# Patient Record
Sex: Female | Born: 1965 | Race: White | Hispanic: No | Marital: Married | Smoking: Never smoker
Health system: Southern US, Community
[De-identification: ages and names within clinical notes are randomized; demographics above are authoritative.]

## PROBLEM LIST (undated history)

## (undated) DIAGNOSIS — G35 Multiple sclerosis: Secondary | ICD-10-CM

## (undated) DIAGNOSIS — I10 Essential (primary) hypertension: Secondary | ICD-10-CM

## (undated) HISTORY — DX: Multiple sclerosis: G35

## (undated) HISTORY — DX: Essential (primary) hypertension: I10

---

## 2007-03-08 HISTORY — PX: DILATION AND CURETTAGE OF UTERUS: SHX78

## 2013-11-19 LAB — BASIC METABOLIC PANEL
Creatinine: 0.8 mg/dL (ref 0.5–1.1)
POTASSIUM: 4.8 mmol/L (ref 3.4–5.3)
SODIUM: 136 mmol/L — AB (ref 137–147)

## 2013-11-19 LAB — LIPID PANEL
Cholesterol: 240 mg/dL — AB (ref 0–200)
HDL: 59 mg/dL (ref 35–70)
LDL CALC: 156 mg/dL
TRIGLYCERIDES: 126 mg/dL (ref 40–160)

## 2013-11-19 LAB — TSH: TSH: 2.39 u[IU]/mL (ref 0.41–5.90)

## 2013-11-19 LAB — HEPATIC FUNCTION PANEL
ALT: 27 U/L (ref 7–35)
AST: 27 U/L (ref 13–35)

## 2014-11-27 ENCOUNTER — Ambulatory Visit (INDEPENDENT_AMBULATORY_CARE_PROVIDER_SITE_OTHER): Payer: BLUE CROSS/BLUE SHIELD | Admitting: Osteopathic Medicine

## 2014-11-27 ENCOUNTER — Encounter: Payer: Self-pay | Admitting: Osteopathic Medicine

## 2014-11-27 VITALS — BP 148/88 | HR 73 | Ht 70.0 in | Wt 266.0 lb

## 2014-11-27 DIAGNOSIS — H6190 Disorder of external ear, unspecified, unspecified ear: Secondary | ICD-10-CM

## 2014-11-27 DIAGNOSIS — G35 Multiple sclerosis: Secondary | ICD-10-CM

## 2014-11-27 DIAGNOSIS — H61899 Other specified disorders of external ear, unspecified ear: Secondary | ICD-10-CM

## 2014-11-27 DIAGNOSIS — H60391 Other infective otitis externa, right ear: Secondary | ICD-10-CM | POA: Diagnosis not present

## 2014-11-27 DIAGNOSIS — H7291 Unspecified perforation of tympanic membrane, right ear: Secondary | ICD-10-CM | POA: Diagnosis not present

## 2014-11-27 DIAGNOSIS — H60399 Other infective otitis externa, unspecified ear: Secondary | ICD-10-CM | POA: Insufficient documentation

## 2014-11-27 DIAGNOSIS — H6193 Disorder of external ear, unspecified, bilateral: Secondary | ICD-10-CM

## 2014-11-27 HISTORY — DX: Multiple sclerosis: G35

## 2014-11-27 NOTE — Progress Notes (Signed)
HPI: Kayla Walter is a 49 y.o. female who presents to [facility]  today for chief complaint of:  Chief Complaint  Patient presents with  . Establish Care    MS since 2004, R ear pain   R ear pain . Location: R ear . Quality: pressure/pain . Severity: moderate . Duration: few weeks . Context: over past few years has had major ear infections, seen in [place]. Reports hearing loss and pressure which improved with [facility] flushing at [facility] . Modifying factors: never had abx otic drops filled . Assoc signs/symptoms: no cough, (+) sinus pressure/congestion   Past medical, social and family history reviewed: No past medical history on file. No past surgical history on file. Social History  Substance Use Topics  . Smoking status: Not on file  . Smokeless tobacco: Not on file  . Alcohol Use: Not on file   No family history on file.  No current outpatient prescriptions on file.   No current facility-administered medications for this visit.   Allergies not on file    Review of Systems: CONSTITUTIONAL: Neg fever/chills, no unintentional weight changes, (+) weakness generalized/fatigue HEAD/EYES/EARS/NOSE/THROAT: No headache/vision change, no sore throat (+)hearing concerns in R ear (+) pain /pressure R ear CARDIAC: No chest pain/pressure/palpitations, no orthopnea RESPIRATORY: No cough/shortness of breath/wheeze GASTROINTESTINAL: No nausea/vomiting/abdominal pain/blood in stool/diarrhea/constipation MUSCULOSKELETAL: (+) myalgia/arthralgia GENITOURINARY: (+) incontinence, No abnormal genital bleeding/discharge SKIN: No rash/wounds/concerning lesions HEM/ONC: (+) easy bruising/bleeding, no abnormal lymph node ENDOCRINE: No polyuria/polydipsia/polyphagia, no heat/cold intolerance  NEUROLOGIC: No dizzines/slurred speech. (+)MS. (+)concern for h/a, memory problems PSYCHIATRIC: No concerns with depression/anxiety or sleep problems    Exam:  There were no  vitals taken for this visit. Constitutional: VSS, see above. General Appearance: alert, well-developed, well-nourished, NAD Eyes: Normal lids and conjunctive, non-icteric sclera, PERRLA Ears, Nose, Mouth, Throat: Normal external inspection nares/mouth/lips/gums, Normal external canal and TM on L, MMM, posterior pharynx without erythema/exudate. R ear (+) debris obsciring TM, see below for procedure note. Debris appears to be wax/skin sloughed off canal. Once debris removed, skin reveled to be erythematous,  Possible purulence vs debris. Pt unable to tolerate further exam Respiratory: Normal respiratory effort. no wheeze/rhonchi/rales Cardiovascular: S1/S2 normal, no murmur/rub/gallop auscultated. RRR.   No results found for this or any previous visit (from the past 72 hour(s)).  Ceruminosis/debris is noted.  Wax/debris is removed by syringing and manual debridement. ENT referral pending.   ASSESSMENT/PLAN:  Otitis, externa, infective, right - Recently seen at [facility] given Ofloxacin 10/29/2014 - Plan: Ambulatory referral to ENT, fill ofloxacin and further recs per ENT - cannot determine if TM ruptured, unable to visualize due to debris and patient unable to tolerate further cleaning.   Ruptured tympanic membrane, right - Plan: Ambulatory referral to ENT  Multiple sclerosis - Diagnosed 2004    Separate visit to address incontinence, fatigue, heme concerns as per ROS

## 2014-11-28 ENCOUNTER — Telehealth: Payer: Self-pay | Admitting: Osteopathic Medicine

## 2014-11-28 ENCOUNTER — Telehealth: Payer: Self-pay | Admitting: *Deleted

## 2014-11-28 NOTE — Telephone Encounter (Signed)
Patient called adv that she has rescheduled appt for ent until 12/09/14 but request to have the specimen that was found from ear so she can take with her to her ent appt. Pt has a f/up appt in our office next Thurs 12/04/14. Thanks

## 2014-11-28 NOTE — Telephone Encounter (Signed)
Patient had called in with questions concerning specimen from ear. Wanting to know if the specimen was sent to pathology. Dr Lyn Hollingshead said she could come pick up specimen & take to ENT appointment. Patient was originally scheduled for appointment today @ [facility] @ 2:00. Well patient rescheduled appointment for 10/3 due to childcare issue. Has a f/u appointment on 10/29 & would Dr Lyn Hollingshead to hold specimen until then.

## 2014-11-28 NOTE — Telephone Encounter (Signed)
Ok

## 2014-12-04 ENCOUNTER — Ambulatory Visit: Payer: BLUE CROSS/BLUE SHIELD | Admitting: Osteopathic Medicine

## 2014-12-04 ENCOUNTER — Ambulatory Visit (INDEPENDENT_AMBULATORY_CARE_PROVIDER_SITE_OTHER): Payer: BLUE CROSS/BLUE SHIELD | Admitting: Osteopathic Medicine

## 2014-12-04 ENCOUNTER — Encounter: Payer: Self-pay | Admitting: Osteopathic Medicine

## 2014-12-04 VITALS — BP 150/90 | HR 77 | Ht 70.0 in

## 2014-12-04 DIAGNOSIS — IMO0001 Reserved for inherently not codable concepts without codable children: Secondary | ICD-10-CM

## 2014-12-04 DIAGNOSIS — H60391 Other infective otitis externa, right ear: Secondary | ICD-10-CM | POA: Diagnosis not present

## 2014-12-04 DIAGNOSIS — H6190 Disorder of external ear, unspecified, unspecified ear: Secondary | ICD-10-CM

## 2014-12-04 DIAGNOSIS — H6193 Disorder of external ear, unspecified, bilateral: Secondary | ICD-10-CM | POA: Diagnosis not present

## 2014-12-04 DIAGNOSIS — B369 Superficial mycosis, unspecified: Secondary | ICD-10-CM

## 2014-12-04 DIAGNOSIS — B368 Other specified superficial mycoses: Secondary | ICD-10-CM | POA: Diagnosis not present

## 2014-12-04 DIAGNOSIS — E669 Obesity, unspecified: Secondary | ICD-10-CM

## 2014-12-04 DIAGNOSIS — Z13220 Encounter for screening for lipoid disorders: Secondary | ICD-10-CM | POA: Diagnosis not present

## 2014-12-04 DIAGNOSIS — H61899 Other specified disorders of external ear, unspecified ear: Secondary | ICD-10-CM

## 2014-12-04 DIAGNOSIS — R030 Elevated blood-pressure reading, without diagnosis of hypertension: Secondary | ICD-10-CM

## 2014-12-04 DIAGNOSIS — H6240 Otitis externa in other diseases classified elsewhere, unspecified ear: Principal | ICD-10-CM

## 2014-12-04 MED ORDER — CLOTRIMAZOLE 1 % EX SOLN: 1.0000 "application " | Freq: Two times a day (BID) | CUTANEOUS | Status: DC

## 2014-12-04 NOTE — Progress Notes (Signed)
HPI: Kayla Walter is a 49 y.o. female who presents to [facility]  today for chief complaint of:  No chief complaint on file.  R ear pain . Location: R ear . Quality: pressure/pain, itching . Severity: moderate . Duration: 3 weeks . Context: over past few years has had major ear infections, seen in [place]. Reports hearing loss and pressure which improved with ear flushing at [facility] and was feeling better after our visit last week when debris was removed. Last visit started on topical abx but hasn't been taking these regularly. Hasn't seen ENT, an urgent next-day referral was placed however patient had to reschedule due to childcare problems.    Past medical, social and family history reviewed: Past Medical History  Diagnosis Date  . Multiple sclerosis 11/27/2014    Diagnosed 2004   Past Surgical History  Procedure Laterality Date  . Dilation and curettage of uterus  2009    Miscarriage   Social History  Substance Use Topics  . Smoking status: Never Smoker   . Smokeless tobacco: Not on file  . Alcohol Use: No   Family History  Problem Relation Age of Onset  . Alcohol abuse Mother   . Lung cancer Mother   . Depression Mother   . Hyperlipidemia Mother   . Hypertension Mother   . Alcohol abuse Father   . Lung cancer Father   . Alcohol abuse Brother   . HIV Brother   . Diabetes Maternal Grandfather     Current Outpatient Prescriptions  Medication Sig Dispense Refill  . ofloxacin (FLOXIN) 0.3 % otic solution Place 10 drops into the right ear daily.    . clotrimazole (LOTRIMIN) 1 % external solution Apply 1 application topically 2 (two) times daily. 3-4 drops in the ear twice daily for 10-14 days 30 mL 0   No current facility-administered medications for this visit.   No Known Allergies    Review of Systems: CONSTITUTIONAL: Neg fever/chills, HEAD/EYES/EARS/NOSE/THROAT: No headache/vision change, no sore throat (-)hearing concerns in R  ear (+) pain/itching R ear CARDIAC: No chest pain/pressure/palpitations, no orthopnea RESPIRATORY: No cough/shortness of breath/wheeze   Exam:  BP 176/75 mmHg  Pulse 77  Ht 5\' 10"  (1.778 m) BP improved on recheck Constitutional: VSS, see above. General Appearance: alert, well-developed, well-nourished, NAD Eyes: Normal lids and conjunctive, non-icteric sclera, PERRLA Ears, Nose, Mouth, Throat: Normal external inspection nares/mouth/lips/gums, Normal external canal and TM on L, MMM, posterior pharynx without erythema/exudate. R ear (+) whitish coating ear canal, no purulence, mild erythema, still unablet o determine if TM present, likely not... Pt unable to tolerate complete cleaning  Respiratory: Normal respiratory effort. no wheeze/rhonchi/rales Cardiovascular: S1/S2 normal, no murmur/rub/gallop auscultated. RRR.   No results found for this or any previous visit (from the past 72 hour(s)).  Debris/yeast is noted in R ear canal.  Wax/debris is removed by manual debridement. ENT referral pending.   ASSESSMENT/PLAN:  Otomycosis - Plan: clotrimazole (LOTRIMIN) 1 % external solution, stop ofloxacin, stop ofloxacin since she wasn't compliant with this at any rate. Advised MUST see ENT, she states she will keep her appt 4 days from now. Advised needs to go to ENT sooner if possible, needs to go to ER if hearing loss or vision changes. Culture collected but treat empirically for yeast, wet mount sent which will hopefully confirm candida if this is present though culture will likely not be benficial (per UpToDate, reserve fungal culture for severe cases/immunocompromise), will defer further management to ENT  Otitis, externa, infective, right  Debris in ear canal  Lipid screening - Plan: Lipid panel  Obesity - Plan: TSH  Elevated blood pressure - Plan: COMPLETE METABOLIC PANEL WITH GFR, CBC with Differential/Platelet - pt states is always high in doctors offices...  Separate visit to address  annual physical, BP, incontinence, fatigue, other concerns as per ROS

## 2014-12-04 NOTE — Patient Instructions (Signed)
YOU NEED TO USE EAR DROPS AS DIRECTED. YOU NEED TO SEE THE EAR/NOSE/THROAT SPECIALIST ASAP.

## 2014-12-04 NOTE — Addendum Note (Signed)
Addended by: Juel Burrow on: 12/04/2014 02:47 PM   Modules accepted: Orders

## 2014-12-06 LAB — EAR CULTURE

## 2015-01-16 ENCOUNTER — Ambulatory Visit (INDEPENDENT_AMBULATORY_CARE_PROVIDER_SITE_OTHER): Payer: BLUE CROSS/BLUE SHIELD | Admitting: Family Medicine

## 2015-01-16 ENCOUNTER — Encounter: Payer: Self-pay | Admitting: Family Medicine

## 2015-01-16 VITALS — BP 158/90 | HR 76 | Temp 99.1°F | Resp 18

## 2015-01-16 DIAGNOSIS — IMO0001 Reserved for inherently not codable concepts without codable children: Secondary | ICD-10-CM

## 2015-01-16 DIAGNOSIS — J0100 Acute maxillary sinusitis, unspecified: Secondary | ICD-10-CM | POA: Diagnosis not present

## 2015-01-16 DIAGNOSIS — R030 Elevated blood-pressure reading, without diagnosis of hypertension: Secondary | ICD-10-CM | POA: Diagnosis not present

## 2015-01-16 MED ORDER — AZITHROMYCIN 250 MG PO TABS: ORAL_TABLET | ORAL | Status: AC

## 2015-01-16 MED ORDER — FLUTICASONE PROPIONATE 50 MCG/ACT NA SUSP: 1.0000 | Freq: Every day | NASAL | Status: DC

## 2015-01-16 NOTE — Progress Notes (Signed)
   Subjective:    Patient ID: Kayla Walter, female    DOB: 1965-10-10, 49 y.o.   MRN: [id number]  HPI Has ST that started yesterday. Started feeling bad last night.  + low grade temp. She has severe congestion.  No cough. + exposure to ST. one of her children and she had strep throat last week and her other child had a severe cold over the weekend. She says she's had colds before but this is very different. She said it hit very hard. She has a sore throat mostly on the right. And says that her teeth and facial cheek hurting the worse on the right.   Review of Systems     Objective:   Physical Exam  Constitutional: She is oriented to person, place, and time. She appears well-developed and well-nourished.  HENT:  Head: Normocephalic and atraumatic.  Right Ear: External ear normal.  Left Ear: External ear normal.  Nose: Nose normal.  Mouth/Throat: Oropharynx is clear and moist.  TMs and canals are clear.   Eyes: Conjunctivae and EOM are normal. Pupils are equal, round, and reactive to light.  Neck: Neck supple. No thyromegaly present.  Cardiovascular: Normal rate, regular rhythm and normal heart sounds.   Pulmonary/Chest: Effort normal and breath sounds normal. She has no wheezes.  Lymphadenopathy:    She has no cervical adenopathy.  Neurological: She is alert and oriented to person, place, and time.  Skin: Skin is warm and dry.  Psychiatric: She has a normal mood and affect.          Assessment & Plan:  Acute sinusitis with pharyngitis-will test for strep throat since she did have a positive family member at home with it last week. Certainly possible she could have strep throat along with an upper respiratory infection. Also consider this could be viral. They with the severity of her symptoms and the abruptness of onset we'll consider treatment azithromycin. We'll give her prescription to fill if she is feeling worse over the weekend. We'll also add fluticasone to help open up the  nasal passages. Okay to use Mucinex if she would like. I would consider flu but she has not cough.    Elevated blood pressure-most likely cause of her headaches though he is acute illness can cause this as well. We discussed getting a home blood pressure cuff and try taking at home and then coming in a few weeks when she's feeling better and bringing her home cuff with her to compare to ours. If it's accurate then she may just have whitecoat hypertension. But it looks like her pressures of an elevated last couple time she's been here. I think we just need to confirm whether not she actually has a diagnosis of high blood pressure or not.

## 2015-01-16 NOTE — Patient Instructions (Signed)

## 2015-02-11 ENCOUNTER — Encounter: Payer: Self-pay | Admitting: Osteopathic Medicine

## 2015-07-13 ENCOUNTER — Ambulatory Visit: Payer: BLUE CROSS/BLUE SHIELD | Admitting: Advanced Practice Midwife

## 2015-07-22 ENCOUNTER — Telehealth: Payer: Self-pay | Admitting: Osteopathic Medicine

## 2015-07-22 ENCOUNTER — Encounter: Payer: Self-pay | Admitting: Obstetrics & Gynecology

## 2015-07-22 ENCOUNTER — Ambulatory Visit (INDEPENDENT_AMBULATORY_CARE_PROVIDER_SITE_OTHER): Payer: BLUE CROSS/BLUE SHIELD | Admitting: Obstetrics & Gynecology

## 2015-07-22 VITALS — BP 152/98 | HR 80 | Ht 70.0 in | Wt 275.0 lb

## 2015-07-22 DIAGNOSIS — Z1151 Encounter for screening for human papillomavirus (HPV): Secondary | ICD-10-CM | POA: Diagnosis not present

## 2015-07-22 DIAGNOSIS — Z124 Encounter for screening for malignant neoplasm of cervix: Secondary | ICD-10-CM | POA: Diagnosis not present

## 2015-07-22 DIAGNOSIS — IMO0001 Reserved for inherently not codable concepts without codable children: Secondary | ICD-10-CM

## 2015-07-22 DIAGNOSIS — Z01419 Encounter for gynecological examination (general) (routine) without abnormal findings: Secondary | ICD-10-CM

## 2015-07-22 DIAGNOSIS — R300 Dysuria: Secondary | ICD-10-CM

## 2015-07-22 DIAGNOSIS — E669 Obesity, unspecified: Secondary | ICD-10-CM

## 2015-07-22 DIAGNOSIS — R030 Elevated blood-pressure reading, without diagnosis of hypertension: Principal | ICD-10-CM

## 2015-07-22 NOTE — Progress Notes (Signed)
Subjective:    Kayla Walter is a 50 y.o. MW P0 female who presents for an annual exam. The patient has no complaints today. She is having some vulvovaginal itching for several weeks. She has not used any OTC meds.  The patient is sexually active. GYN screening history: last pap: was abnormal: with cotesting. The patient wears seatbelts: yes. The patient participates in regular exercise: no. Has the patient ever been transfused or tattooed?: yes. The patient reports that there is not domestic violence in her life.   Menstrual History: OB History    No data available      Menarche age: 3  Patient's last menstrual period was 07/11/2015.    The following portions of the patient's history were reviewed and updated as appropriate: allergies, current medications, past family history, past medical history, past social history, past surgical history and problem list.  Review of Systems Pertinent items noted in HPI and remainder of comprehensive ROS otherwise negative.  Married for 8 years, denies dyspareunia. Homemaker, in the process of adopting 3 sons (sibs- 15,12, and 33 yo). Moved here from [facility] about a 18 months ago. Mammogram due. Periods are like clockwork, monthly.    Objective:    BP 152/98 mmHg  Pulse 80  Ht 5\' 10"  (1.778 m)  Wt 275 lb (124.739 kg)  BMI 39.46 kg/m2  LMP 07/11/2015  General Appearance:    Alert, cooperative, no distress, appears stated age  Head:    Normocephalic, without obvious abnormality, atraumatic  Eyes:    PERRL, conjunctiva/corneas clear, EOM's intact, fundi    benign, both eyes  Ears:    Normal TM's and external ear canals, both ears  Nose:   Nares normal, septum midline, mucosa normal, no drainage    or sinus tenderness  Throat:   Lips, mucosa, and tongue normal; teeth and gums normal  Neck:   Supple, symmetrical, trachea midline, no adenopathy;    thyroid:  no enlargement/tenderness/nodules; no carotid   bruit or JVD  Back:     Symmetric, no curvature,  ROM normal, no CVA tenderness  Lungs:     Clear to auscultation bilaterally, respirations unlabored  Chest Wall:    No tenderness or deformity   Heart:    Regular rate and rhythm, S1 and S2 normal, no murmur, rub   or gallop  Breast Exam:    No tenderness, masses, or nipple abnormality  Abdomen:     Soft, non-tender, bowel sounds active all four quadrants,    no masses, no organomegaly  Genitalia:    Normal female without lesion, discharge or tenderness, NSSA, NT, no palpable adnexal masses     Extremities:   Extremities normal, atraumatic, no cyanosis or edema  Pulses:   2+ and symmetric all extremities  Skin:   Skin color, texture, turgor normal, no rashes or lesions  Lymph nodes:   Cervical, supraclavicular, and axillary nodes normal  Neurologic:   CNII-XII intact, normal strength, sensation and reflexes    throughout  .    Assessment:    Healthy female exam.    Plan:     Mammogram. Thin prep Pap smear.   Rec colon cancer screening (discussed colonoscopy and cologuard)

## 2015-07-22 NOTE — Telephone Encounter (Signed)
Patient needs to have labs done/she never had done in Sept. 2016.  Can you please reorder the labs so she can go tomorrow morning to have them done?  thanks

## 2015-07-22 NOTE — Telephone Encounter (Signed)
LABS HAVE BEEN ORDERED. Tanice Petre,CMA

## 2015-07-22 NOTE — Addendum Note (Signed)
Addended by: Anell Barr on: 07/22/2015 10:22 AM   Modules accepted: Orders

## 2015-07-23 LAB — CYTOLOGY - PAP

## 2015-07-29 ENCOUNTER — Ambulatory Visit: Payer: BLUE CROSS/BLUE SHIELD

## 2015-08-05 ENCOUNTER — Ambulatory Visit (INDEPENDENT_AMBULATORY_CARE_PROVIDER_SITE_OTHER): Payer: BLUE CROSS/BLUE SHIELD

## 2015-08-05 DIAGNOSIS — Z01419 Encounter for gynecological examination (general) (routine) without abnormal findings: Secondary | ICD-10-CM

## 2015-08-05 DIAGNOSIS — R928 Other abnormal and inconclusive findings on diagnostic imaging of breast: Secondary | ICD-10-CM

## 2015-08-05 NOTE — Addendum Note (Signed)
Addended by: Granville Lewis on: 08/05/2015 09:08 AM   Modules accepted: Orders

## 2015-08-07 LAB — CULTURE, URINE COMPREHENSIVE
COLONY COUNT: NO GROWTH
ORGANISM ID, BACTERIA: NO GROWTH

## 2015-08-11 ENCOUNTER — Encounter: Payer: Self-pay | Admitting: Obstetrics & Gynecology

## 2015-09-03 ENCOUNTER — Other Ambulatory Visit (HOSPITAL_COMMUNITY): Payer: Self-pay | Admitting: Obstetrics & Gynecology

## 2015-09-03 ENCOUNTER — Telehealth: Payer: Self-pay

## 2015-09-03 DIAGNOSIS — R928 Other abnormal and inconclusive findings on diagnostic imaging of breast: Secondary | ICD-10-CM

## 2015-09-03 DIAGNOSIS — Z0000 Encounter for general adult medical examination without abnormal findings: Secondary | ICD-10-CM

## 2015-09-03 NOTE — Telephone Encounter (Signed)
Patient called stated that she is getting labs done on tomorrow and she request order for labs to get her Liver function tested. Please advise. Rhonda Cunningham,CMA

## 2015-09-04 LAB — CBC WITH DIFFERENTIAL/PLATELET
BASOS ABS: 89 {cells}/uL (ref 0–200)
Basophils Relative: 1 %
EOS PCT: 5 %
Eosinophils Absolute: 445 cells/uL (ref 15–500)
HCT: 40.7 % (ref 35.0–45.0)
Hemoglobin: 13.6 g/dL (ref 11.7–15.5)
LYMPHS PCT: 26 %
Lymphs Abs: 2314 cells/uL (ref 850–3900)
MCH: 29.3 pg (ref 27.0–33.0)
MCHC: 33.4 g/dL (ref 32.0–36.0)
MCV: 87.7 fL (ref 80.0–100.0)
MONOS PCT: 11 %
MPV: 9.8 fL (ref 7.5–12.5)
Monocytes Absolute: 979 cells/uL — ABNORMAL HIGH (ref 200–950)
NEUTROS ABS: 5073 {cells}/uL (ref 1500–7800)
Neutrophils Relative %: 57 %
PLATELETS: 330 10*3/uL (ref 140–400)
RBC: 4.64 MIL/uL (ref 3.80–5.10)
RDW: 14.1 % (ref 11.0–15.0)
WBC: 8.9 10*3/uL (ref 3.8–10.8)

## 2015-09-04 NOTE — Telephone Encounter (Signed)
Orders in - patient still due for annual physical exam / wellness visit, please ask her to make an appointment.

## 2015-09-04 NOTE — Telephone Encounter (Signed)
Left message on patient vm advising patient to schedule appointment. Rhonda Cunningham,CMA

## 2015-09-05 LAB — COMPLETE METABOLIC PANEL WITH GFR
ALT: 17 U/L (ref 6–29)
AST: 17 U/L (ref 10–35)
Albumin: 4.2 g/dL (ref 3.6–5.1)
Alkaline Phosphatase: 63 U/L (ref 33–130)
BILIRUBIN TOTAL: 0.5 mg/dL (ref 0.2–1.2)
BUN: 13 mg/dL (ref 7–25)
CHLORIDE: 104 mmol/L (ref 98–110)
CO2: 24 mmol/L (ref 20–31)
Calcium: 9.2 mg/dL (ref 8.6–10.4)
Creat: 0.76 mg/dL (ref 0.50–1.05)
GFR, Est African American: >89 mL/min (ref >=60)
GLUCOSE: 90 mg/dL (ref 65–99)
POTASSIUM: 4.1 mmol/L (ref 3.5–5.3)
SODIUM: 138 mmol/L (ref 135–146)
TOTAL PROTEIN: 7.2 g/dL (ref 6.1–8.1)

## 2015-09-05 LAB — TSH: TSH: 2.73 mIU/L

## 2015-09-09 ENCOUNTER — Other Ambulatory Visit: Payer: BLUE CROSS/BLUE SHIELD

## 2015-09-09 ENCOUNTER — Telehealth: Payer: Self-pay

## 2015-09-09 ENCOUNTER — Ambulatory Visit: Payer: BLUE CROSS/BLUE SHIELD | Admitting: Osteopathic Medicine

## 2015-09-09 NOTE — Telephone Encounter (Signed)
Patient called stated that she had to cancel her appt that was scheduled for today. She had labs done on 09/04/2015 and she request her lab results. Adler Alton,CMA

## 2015-09-10 NOTE — Telephone Encounter (Signed)
Spoke to patient gave her advise as noted below. Auston Halfmann,CMA  

## 2015-09-10 NOTE — Telephone Encounter (Signed)
I had no concerns based on the patient's lab results. However fairly duplicate orders from place, I don't see that a lipid panel/cholesterol was done. So I couldn't tell the patient anything about these results. Would still encourage her to come in for annual physical to discuss these results and address other preventive care measures

## 2015-10-19 ENCOUNTER — Encounter: Payer: Self-pay | Admitting: Osteopathic Medicine

## 2015-10-19 ENCOUNTER — Ambulatory Visit (INDEPENDENT_AMBULATORY_CARE_PROVIDER_SITE_OTHER): Payer: BLUE CROSS/BLUE SHIELD | Admitting: Osteopathic Medicine

## 2015-10-19 VITALS — BP 135/85 | Ht 70.0 in | Wt 278.0 lb

## 2015-10-19 DIAGNOSIS — G35 Multiple sclerosis: Secondary | ICD-10-CM

## 2015-10-19 DIAGNOSIS — Z0000 Encounter for general adult medical examination without abnormal findings: Secondary | ICD-10-CM | POA: Diagnosis not present

## 2015-10-19 DIAGNOSIS — R928 Other abnormal and inconclusive findings on diagnostic imaging of breast: Secondary | ICD-10-CM

## 2015-10-19 NOTE — Progress Notes (Signed)
HPI: Kayla Walter is a 50 y.o. female  who [facility], 10/19/15,  for chief complaint of:  Chief Complaint  Patient presents with  . Annual Exam     Annual physical exam: No complaints at this time.  She does bring mammogram report, abnormality on right breast ultrasound which was recommended for further workup with biopsy, however patient had biopsy of similar problem on left side, ended up developing significant infection. She would rather not proceed with biopsy, prefers to monitor with imaging  Requests referral to neurology to establish care for MS. Patient is interested in prescription medications at this point, she is treating with alternative medicine. No recent flares or new symptoms of concern.   Past medical, surgical, social and family history reviewed: Past Medical History:  Diagnosis Date  . Multiple sclerosis (HCC) 11/27/2014   Diagnosed 2004   Past Surgical History:  Procedure Laterality Date  . DILATION AND CURETTAGE OF UTERUS  2009   Miscarriage   Social History  Substance Use Topics  . Smoking status: Never Smoker  . Smokeless tobacco: Never Used  . Alcohol use No   Family History  Problem Relation Age of Onset  . Alcohol abuse Mother   . Lung cancer Mother   . Depression Mother   . Hyperlipidemia Mother   . Hypertension Mother   . Alcohol abuse Father   . Lung cancer Father   . Alcohol abuse Brother   . HIV Brother   . Diabetes Maternal Grandfather      Current medication list and allergy/intolerance information reviewed:   Current Outpatient Prescriptions  Medication Sig Dispense Refill  . clotrimazole (LOTRIMIN) 1 % external solution Apply 1 application topically 2 (two) times daily. 3-4 drops in the ear twice daily for 10-14 days 30 mL 0  . fluticasone (FLONASE) 50 MCG/ACT nasal spray Place 1-2 sprays into both nostrils daily. 16 g 6   No current facility-administered medications for  this visit.    No Known Allergies    Review of Systems:  Constitutional:  No  fever, no chills, No recent illness, No unintentional weight changes. No significant fatigue.   HEENT: No  headache  Cardiac: No  chest pain, No  pressure, No palpitations  Respiratory:  No  shortness of breath. No  Cough  Gastrointestinal: No  abdominal pain, No  nausea, No  vomiting,   Musculoskeletal: No new myalgia/arthralgia  Skin: No  Rash  Neurologic: No  weakness, No  dizziness  Psychiatric: No  concerns with depression, No  concerns with anxiety  Exam:  BP 135/85   Ht 5\' 10"  (1.778 m)   Wt 278 lb (126.1 kg)   BMI 39.89 kg/m   Constitutional: VS see above. General Appearance: alert, well-developed, well-nourished, NAD  Eyes: Normal lids and conjunctive, non-icteric sclera  Ears, Nose, Mouth, Throat: MMM, Normal external inspection ears/nares/mouth/lips/gums. TM normal bilaterally. Pharynx/tonsils no erythema, no exudate. Nasal mucosa normal.   Neck: No masses, trachea midline. No thyroid enlargement. No tenderness/mass appreciated. No lymphadenopathy  Respiratory: Normal respiratory effort. no wheeze, no rhonchi, no rales  Cardiovascular: S1/S2 normal, no murmur, no rub/gallop auscultated. RRR. No lower extremity edema. Pedal pulse II/IV bilaterally DP and PT.   Gastrointestinal: Nontender, no masses. No hepatomegaly, no splenomegaly. No hernia appreciated. Bowel sounds normal. Rectal exam deferred.   Musculoskeletal: Gait normal. No clubbing/cyanosis of digits.   Neurological: No cranial nerve deficit on limited exam. Motor and sensation intact and symmetric.  Cerebellar reflexes intact. Normal balance/coordination. No tremor.   Skin: warm, dry, intact. No rash/ulcer. No concerning nevi or subq nodules on limited exam.    Psychiatric: Normal judgment/insight. Normal mood and affect. Oriented x3.    Labs reviewed 09/04/2015: TSH normal, CBC within normal limits, CMP within  normal limits, vitamin D and lipid panel and HIV were not resulted     ASSESSMENT/PLAN:   Annual physical exam - Plan: Cholesterol, Total, VITAMIN D 25 Hydroxy (Vit-D Deficiency, Fractures)  Multiple sclerosis (HCC) - Plan: Ambulatory referral to Neurology  Abnormal ultrasound of breast - Risks versus benefits of biopsy as compared to monitoring with imaging discussed. Patient opts to monitor. Advised she should talk about this with [facility] - consider ultrasound every 6 months for monitoring   FEMALE PREVENTIVE CARE  ANNUAL SCREENING/COUNSELING Tobacco - noNever  Alcohol - social drinker Diet/Exercise - HEALTHY HABITS DISCUSSED TO DECREASE CV RISK - nothing in particular Depression - PQH2 Negative Domestic violence concerns - no HTN SCREENING - SEE VITALS Vaccination status - SEE BELOW  SEXUAL HEALTH Sexually active in the past year - yes With - Yes with female. STI - The patient denies history of sexually transmitted disease. STI testing today? - no  INFECTIOUS DISEASE SCREENING HIV - all adults 15-65 - does not need GC/CT - sexually active - does not need HepC - DOB [birth year] - does not need TB - does not need  DISEASE SCREENING Lipid - needs DM2 - does not need Osteoporosis - does not need  CANCER SCREENING Cervical - does not need Breast -- needs follow-up for abnormal finding Lung - does not need Colon - does not need - OB/GYN provided her with cologuard  ADULT VACCINATION Influenza - was offered and declined by the patient Td - was offered and declined by the patient HPV - was not indicated Zoster - was not indicated Pneumonia - was offered and declined by the patient  OTHER Fall - exercise and Vit D age 69+ - does not need Consider ASA - age 32-59 - does not need       Visit summary with medication list and pertinent instructions was printed for patient to review. All questions at time of visit were answered - patient instructed to contact  office with any additional concerns. ER/RTC precautions were reviewed with the patient. Follow-up plan: Return in about 1 year (around 10/18/2016), or sooner if needed, for ANNUAL PHYSICAL.

## 2016-04-13 ENCOUNTER — Telehealth: Payer: Self-pay | Admitting: Osteopathic Medicine

## 2016-04-13 NOTE — Telephone Encounter (Signed)
Due for repeat breast ultrasound per my records. I went to place orders for these but I think they are already there ordered by Dr Marice Potter, can we confirm with Dr. Ellin Saba office? Thanks.

## 2016-04-15 NOTE — Telephone Encounter (Signed)
Order is in. Sage Kopera,CMA

## 2016-06-28 ENCOUNTER — Ambulatory Visit (INDEPENDENT_AMBULATORY_CARE_PROVIDER_SITE_OTHER): Payer: BLUE CROSS/BLUE SHIELD | Admitting: Osteopathic Medicine

## 2016-06-28 ENCOUNTER — Ambulatory Visit (INDEPENDENT_AMBULATORY_CARE_PROVIDER_SITE_OTHER): Payer: BLUE CROSS/BLUE SHIELD

## 2016-06-28 ENCOUNTER — Encounter: Payer: Self-pay | Admitting: Osteopathic Medicine

## 2016-06-28 VITALS — Temp 99.0°F | Ht 70.0 in

## 2016-06-28 DIAGNOSIS — R918 Other nonspecific abnormal finding of lung field: Secondary | ICD-10-CM

## 2016-06-28 DIAGNOSIS — R05 Cough: Secondary | ICD-10-CM

## 2016-06-28 DIAGNOSIS — J22 Unspecified acute lower respiratory infection: Secondary | ICD-10-CM | POA: Diagnosis not present

## 2016-06-28 DIAGNOSIS — R059 Cough, unspecified: Secondary | ICD-10-CM

## 2016-06-28 MED ORDER — GUAIFENESIN-CODEINE 100-10 MG/5ML PO SYRP
5.0000 mL | ORAL_SOLUTION | Freq: Four times a day (QID) | ORAL | 0 refills | Status: DC | PRN
Start: 2016-06-28 — End: 2017-01-10

## 2016-06-28 MED ORDER — AZITHROMYCIN 250 MG PO TABS: ORAL_TABLET | ORAL | 0 refills | Status: DC

## 2016-06-28 NOTE — Progress Notes (Signed)
HPI: Kayla Walter is a 51 y.o. female who presents to [facility] 06/28/16 for chief complaint of:  Chief Complaint  Patient presents with  . Cough    PATIENT REFUSED WEIGHT AND BLOOD PRESSURE  . Fever    Acute Illness: . Context: both kids recently diagnosed with pneumonia . Location & Quality: chest, coughing occasionally productive of yellowish/whitish sputum . Assoc signs/symptoms: fever as high as 101 . Duration: 5-6 days . Modifying factors: has tried the following OTC/Rx medications: cold/flu and cough formulations, antipyretics    Past medical, social and family history reviewed.  Immune compromising conditions or other risk factors: none  Current medications and allergies reviewed.     Review of Systems:  Constitutional: Yes  fever/chills  HEENT: No  headache, No  sore throat, No  swollen glands  Cardiovascular: No chest pain  Respiratory:Yes  cough, No  shortness of breath  Gastrointestinal: No  nausea, No  vomiting,  No  diarrhea  Musculoskeletal:   No  myalgia/arthralgia  Skin/Integument:  No  rash   Detailed Exam:  Temp 99 F (37.2 C) (Oral)   Ht 5\' 10"  (1.778 m)   Patient refused weight and blood pressure measurement   Constitutional:   VSS, see above.   General Appearance: alert, well-developed, well-nourished, NAD  Eyes:   Normal lids and conjunctive, non-icteric sclera  Ears, Nose, Mouth, Throat:   Normal external inspection ears/nares  Normal mouth/lips/gums, MMM  normal TM  posterior pharynx without erythema, without exudate  nasal mucosa normal  Skin:  Normal inspection, no rash or concerning lesions noted on limited exam  Neck:   No masses, trachea midline. normal lymph nodes  Respiratory:   Normal respiratory effort.   Yes  Wheeze faint expiratory bilaterally without rhonchi/rales  Cardiovascular:   S1/S2 normal, no murmur/rub/gallop auscultated. RRR.   CXR on personal  review Cardiomediastinal silhouette/heart size: normal Obvious bony abnormality: none Infiltrate: possible mild on RLL but also might be breast density Mass or other opacity: none Atelectasis: none Diaphragms: normal Lateral view: normal Images were reviewed with the patient. Pt counseled that radiologist will review the images as well, our office will call if the formal read reveals any significant findings other than what has been noted above.   Dg Chest 2 View  Result Date: 06/28/2016 CLINICAL DATA:  Cough, fever ingestion for 4 days EXAM: CHEST  2 VIEW COMPARISON:  None. FINDINGS: Patchy opacities noted in both lower lungs, likely in the right middle lobe and lingula, right greater than left. This could reflect early infiltrate/pneumonia. Heart is normal size. No effusions. No acute bony abnormality. IMPRESSION: Patchy opacities in both lower lungs, right greater than left concerning for possible early pneumonia or bronchopneumonia. Electronically Signed   By: Charlett Nose M.D.   On: 06/28/2016 12:20     ASSESSMENT/PLAN: Abx and cough meds as below, if cough persists would recommend steroid burst and inhaler but if doing better would continue supportive care. Pt advised that declining BP measurement is not advisable but pt is in no distress other than mild acute illness.   Cough - Plan: DG Chest 2 View  Lower respiratory infection (e.g., bronchitis, pneumonia, pneumonitis, pulmonitis) - Plan: azithromycin (ZITHROMAX) 250 MG tablet, guaiFENesin-codeine (ROBITUSSIN AC) 100-10 MG/5ML syrup  Visit summary was printed for the patient with medications and pertinent instructions for patient to review. ER/RTC precautions reviewed. All questions answered. Return if symptoms worsen or fail to improve.

## 2016-10-04 ENCOUNTER — Encounter: Payer: Self-pay | Admitting: Osteopathic Medicine

## 2016-10-04 ENCOUNTER — Ambulatory Visit (INDEPENDENT_AMBULATORY_CARE_PROVIDER_SITE_OTHER): Payer: BLUE CROSS/BLUE SHIELD | Admitting: Osteopathic Medicine

## 2016-10-04 VITALS — BP 152/74 | HR 73 | Ht 70.0 in | Wt 274.0 lb

## 2016-10-04 DIAGNOSIS — G35 Multiple sclerosis: Secondary | ICD-10-CM

## 2016-10-04 DIAGNOSIS — I10 Essential (primary) hypertension: Secondary | ICD-10-CM | POA: Diagnosis not present

## 2016-10-04 HISTORY — DX: Essential (primary) hypertension: I10

## 2016-10-04 NOTE — Progress Notes (Signed)
HPI: Kayla Walter is a 51 y.o. female  who presents to [facility] today, 10/04/16,  for chief complaint of:  Chief Complaint  Patient presents with  . Hypertension    Always has dizziness, HA, blurred vision - not concerned about these, chronic d/t MS history per patient.   Her cuff 154/81  Ours 152/74   Pt refused BP measurement last time she was here in 06/2016. Last BP otherwise on file is from her neurologist was 166/99 12/2015.  Home blood pressure measurements have been typically in the systolic 160s up to even 200s, she does not recheck after 5 minutes of sitting.  She would like to avoid perception medications if possible, she has been taking an over-the-counter herbal supplement of some kind to help reduce blood pressure and thinks this may be working since recent home blood pressure numbers were higher and office reading is a bit better today.  Past medical history, surgical history, social history and family history reviewed.  Patient Active Problem List   Diagnosis Date Noted  . Multiple sclerosis (HCC) 11/27/2014    Current medication list and allergy/intolerance information reviewed.   Current Outpatient Prescriptions on File Prior to Visit  Medication Sig Dispense Refill  . azithromycin (ZITHROMAX) 250 MG tablet 2 tabs po x1 on Day 1, then 1 tab po daily on Days 2 - 5 6 tablet 0  . clotrimazole (LOTRIMIN) 1 % external solution Apply 1 application topically 2 (two) times daily. 3-4 drops in the ear twice daily for 10-14 days (Patient not taking: Reported on 06/28/2016) 30 mL 0  . fluticasone (FLONASE) 50 MCG/ACT nasal spray Place 1-2 sprays into both nostrils daily. (Patient not taking: Reported on 06/28/2016) 16 g 6  . guaiFENesin-codeine (ROBITUSSIN AC) 100-10 MG/5ML syrup Take 5-10 mLs by mouth 4 (four) times daily as needed for cough. 180 mL 0   No current facility-administered medications on file prior to visit.    No Known  Allergies    Review of Systems:  Constitutional: No recent illness  HEENT: No  headache, no vision change  Cardiac: No  chest pain, No  pressure, No palpitations  Respiratory:  No  shortness of breath. No  Cough  Neurologic: No  weakness, No  Dizziness   Exam:  BP (!) 152/74 Comment: office cuff  Pulse 73   Ht 5\' 10"  (1.778 m)   Wt 274 lb (124.3 kg)   BMI 39.31 kg/m   Constitutional: VS see above. General Appearance: alert, well-developed, well-nourished, NAD  Eyes: Normal lids and conjunctive, non-icteric sclera  Ears, Nose, Mouth, Throat: MMM, Normal external inspection ears/nares/mouth/lips/gums.  Neck: No masses, trachea midline.   Respiratory: Normal respiratory effort. no wheeze, no rhonchi, no rales  Cardiovascular: S1/S2 normal, no murmur, no rub/gallop auscultated. RRR.   Musculoskeletal: Gait normal. Symmetric and independent movement of all extremities  Neurological: Normal balance/coordination. No tremor.  Skin: warm, dry, intact.   Psychiatric: Normal judgment/insight. Normal mood and affect. Oriented x3.     ASSESSMENT/PLAN: Long discussion regarding risks versus benefits of treatment in light of risks versus benefits of uncontrolled high blood pressure over time. Patient would like to hold off on medications and give her herbal supplements a bit more time to work while measuring blood pressure at home and being diligent about rechecking if levels are high. We'll have her come back for annual physical after getting lab work done.   Essential hypertension - Plan: CBC, COMPLETE METABOLIC PANEL WITH GFR,  TSH, Urinalysis, Routine w reflex microscopic  Multiple sclerosis (HCC)     Follow-up plan: Return in about 3 weeks (around 10/25/2016) for [facility] PHYS[facility] and recheck blood pressure .  Visit summary with medication list and pertinent instructions was printed for patient to review, alert [place] if any changes needed. All questions at time of visit were  answered - patient instructed to contact office with any additional concerns. ER/RTC precautions were reviewed with the patient and understanding verbalized.   Note: Total time spent 25 minutes, greater than 50% of the visit was spent face-to-face counseling and coordinating care for the following: The primary encounter diagnosis was Essential hypertension. A diagnosis of Multiple sclerosis (HCC) was also pertinent to this visit.Marland Kitchen

## 2017-01-03 ENCOUNTER — Telehealth: Payer: Self-pay | Admitting: Osteopathic Medicine

## 2017-01-03 NOTE — Telephone Encounter (Signed)
Patient called scheduled an appointment for Tues Nov 6th to discuss getting an ultra sound of bladder. Think its gall stones but not sure. Pt has a radiologist report from seeing specialist and he adv her to see pcp to get an ultra sound found a spot that needs to be recheck'd. Pt will fax over report to [phone] fax. She is hoping to get Ultra Sound on 01/10/17 after appt and imaging adv they can do it as long as order is entered and they are notified so they can schedule her for same day. Thanks

## 2017-01-06 NOTE — Telephone Encounter (Signed)
FYI I haven't received any faxes about this. Will discuss w/ patient when I see her.

## 2017-01-10 ENCOUNTER — Ambulatory Visit: Payer: BLUE CROSS/BLUE SHIELD | Admitting: Osteopathic Medicine

## 2017-01-10 ENCOUNTER — Encounter: Payer: Self-pay | Admitting: Osteopathic Medicine

## 2017-01-10 VITALS — BP 179/92 | HR 60 | Temp 98.5°F | Resp 16 | Wt 280.4 lb

## 2017-01-10 DIAGNOSIS — I10 Essential (primary) hypertension: Secondary | ICD-10-CM | POA: Diagnosis not present

## 2017-01-10 DIAGNOSIS — R9389 Abnormal findings on diagnostic imaging of other specified body structures: Secondary | ICD-10-CM

## 2017-01-10 LAB — COMPLETE METABOLIC PANEL WITH GFR
AG RATIO: 1.2 (calc) (ref 1.0–2.5)
ALT: 22 U/L (ref 6–29)
AST: 18 U/L (ref 10–35)
Albumin: 4 g/dL (ref 3.6–5.1)
Alkaline phosphatase (APISO): 74 U/L (ref 33–130)
BILIRUBIN TOTAL: 0.4 mg/dL (ref 0.2–1.2)
BUN: 10 mg/dL (ref 7–25)
CALCIUM: 9.8 mg/dL (ref 8.6–10.4)
CHLORIDE: 102 mmol/L (ref 98–110)
CO2: 29 mmol/L (ref 20–32)
Creat: 0.78 mg/dL (ref 0.50–1.05)
GFR, EST AFRICAN AMERICAN: 101 mL/min/{1.73_m2} (ref >=60)
GFR, EST NON AFRICAN AMERICAN: 87 mL/min/{1.73_m2} (ref >=60)
GLOBULIN: 3.3 g/dL (ref 1.9–3.7)
Glucose, Bld: 75 mg/dL (ref 65–99)
POTASSIUM: 4.5 mmol/L (ref 3.5–5.3)
SODIUM: 138 mmol/L (ref 135–146)
Total Protein: 7.3 g/dL (ref 6.1–8.1)

## 2017-01-10 LAB — CBC
HCT: 39.4 % (ref 35.0–45.0)
Hemoglobin: 13.2 g/dL (ref 11.7–15.5)
MCH: 29.1 pg (ref 27.0–33.0)
MCHC: 33.5 g/dL (ref 32.0–36.0)
MCV: 86.8 fL (ref 80.0–100.0)
MPV: 10.4 fL (ref 7.5–12.5)
PLATELETS: 309 10*3/uL (ref 140–400)
RBC: 4.54 10*6/uL (ref 3.80–5.10)
RDW: 12.9 % (ref 11.0–15.0)
WBC: 8.1 10*3/uL (ref 3.8–10.8)

## 2017-01-10 LAB — GAMMA GT: GGT: 18 U/L (ref 3–70)

## 2017-01-10 NOTE — Progress Notes (Signed)
HPI: Kayla Walter is a 51 y.o. female with PMH  has a past medical history of Multiple sclerosis (HCC) (11/27/2014).  who [facility] [place] today, 01/10/17,  for chief complaint of:  Chief Complaint  Patient presents with  . Advice Only    US for Gall Stones    See phone encounter 01/03/17. Pt advised us to expect report from specialist re: abn imaging and recommended f/u for what sounds like gallbladder US. Brings report today of XR showing calcifications in RUQ and it does recommend US for f/u. No abd pain, abn stool.   Hx high BP: pt has declined medications in the past, has been on herbals. Has pain with cuff around upper arm, prefers wrist measurement if possible. BP still high on wrist recheck.     Past medical, surgical, social and family history reviewed:  Patient Active Problem List   Diagnosis Date Noted  . Essential hypertension 10/04/2016  . Multiple sclerosis (HCC) 11/27/2014    Past Surgical History:  Procedure Laterality Date  . DILATION AND CURETTAGE OF UTERUS  2009   Miscarriage    Social History   Tobacco Use  . Smoking status: Never Smoker  . Smokeless tobacco: Never Used  Substance Use Topics  . Alcohol use: No    Alcohol/week: 0.0 oz    Family History  Problem Relation Age of Onset  . Alcohol abuse Mother   . Lung cancer Mother   . Depression Mother   . Hyperlipidemia Mother   . Hypertension Mother   . Alcohol abuse Father   . Lung cancer Father   . Alcohol abuse Brother   . HIV Brother   . Diabetes Maternal Grandfather      Current medication list and allergy/intolerance information reviewed:    Current Outpatient Medications  Medication Sig Dispense Refill  . azithromycin (ZITHROMAX) 250 MG tablet 2 tabs po x1 on Day 1, then 1 tab po daily on Days 2 - 5 6 tablet 0  . clotrimazole (LOTRIMIN) 1 % external solution Apply 1 application topically 2 (two) times daily. 3-4 drops in the ear twice daily  for 10-14 days (Patient not taking: Reported on 06/28/2016) 30 mL 0  . fluticasone (FLONASE) 50 MCG/ACT nasal spray Place 1-2 sprays into both nostrils daily. (Patient not taking: Reported on 06/28/2016) 16 g 6  . guaiFENesin-codeine (ROBITUSSIN AC) 100-10 MG/5ML syrup Take 5-10 mLs by mouth 4 (four) times daily as needed for cough. 180 mL 0   No current facility-administered medications for this visit.     No Known Allergies    Review of Systems:  Constitutional:  No  fever, no chills   HEENT: No  headache, no vision change  Cardiac: No  chest pain, No  pressure, No palpitations  Respiratory:  No  shortness of breath.  Gastrointestinal: No  abdominal pain, No  nausea, No  vomiting,  No  blood in stool, No  diarrhea, +constipation   Musculoskeletal: No new myalgia/arthralgia  Neurologic: No  weakness, No  dizziness    Exam:  BP (!) 179/92 (BP Location: Left Wrist, Cuff Size: Large)   Pulse 60   Temp 98.5 F (36.9 C) (Oral)   Resp 16   Wt 280 lb 6.4 oz (127.2 kg)   BMI 40.23 kg/m   Constitutional: VS see above. General Appearance: alert, well-developed, well-nourished, NAD  Eyes: Normal lids and conjunctive, non-icteric sclera  Ears, Nose, Mouth, Throat: MMM, Normal external inspection ears/nares/mouth/lips/gums.  Neck: No masses, trachea midline.   Respiratory: Normal respiratory effort. no wheeze, no rhonchi, no rales  Cardiovascular: S1/S2 normal, no murmur, no rub/gallop auscultated. RRR.   Musculoskeletal: Gait normal.  Neurological: Normal balance/coordination. No tremor.   Skin: warm, dry, intact. No rash/ulcer.  Psychiatric: Normal judgment/insight. Normal mood and affect. Oriented x3.    ASSESSMENT/PLAN:   Abnormal x-ray - Plan: COMPLETE METABOLIC PANEL WITH GFR, CBC, Gamma GT, US Abdomen Limited RUQ  Essential hypertension - edcuated on rirks vs benefits of tx/no tx. Pt aware risks and opts to defer meds.      Visit summary with medication  list and pertinent instructions was printed for patient to review. All questions at time of visit were answered - patient instructed to contact office with any additional concerns. ER/RTC precautions were reviewed with the patient. Follow-up plan: Return if symptoms worsen or fail to improve, and when due for annual physical .   Please note: voice recognition software was used to produce this document, and typos may escape review. Please contact me for any needed clarifications.

## 2017-01-10 NOTE — Telephone Encounter (Signed)
Okay 

## 2017-01-13 ENCOUNTER — Ambulatory Visit (INDEPENDENT_AMBULATORY_CARE_PROVIDER_SITE_OTHER): Payer: BLUE CROSS/BLUE SHIELD

## 2017-01-13 ENCOUNTER — Other Ambulatory Visit: Payer: Self-pay | Admitting: Osteopathic Medicine

## 2017-01-13 DIAGNOSIS — K760 Fatty (change of) liver, not elsewhere classified: Secondary | ICD-10-CM | POA: Diagnosis not present

## 2017-01-13 DIAGNOSIS — Z13220 Encounter for screening for lipoid disorders: Secondary | ICD-10-CM

## 2017-01-13 DIAGNOSIS — K828 Other specified diseases of gallbladder: Secondary | ICD-10-CM

## 2017-01-13 NOTE — Progress Notes (Signed)
Lab order placed per PCP

## 2017-01-14 LAB — LIPID PANEL
Cholesterol: 208 mg/dL — ABNORMAL HIGH
HDL: 57 mg/dL
LDL Cholesterol (Calc): 126 mg/dL — ABNORMAL HIGH
Non-HDL Cholesterol (Calc): 151 mg/dL — ABNORMAL HIGH
Total CHOL/HDL Ratio: 3.6 (calc)
Triglycerides: 140 mg/dL

## 2018-05-20 IMAGING — DX DG CHEST 2V
2 series · 2 of 2 positions shown · non-contrast
Comparison: None.

CLINICAL DATA: Cough, fever ingestion for 4 days

EXAM:
CHEST  2 VIEW

[chest pa]
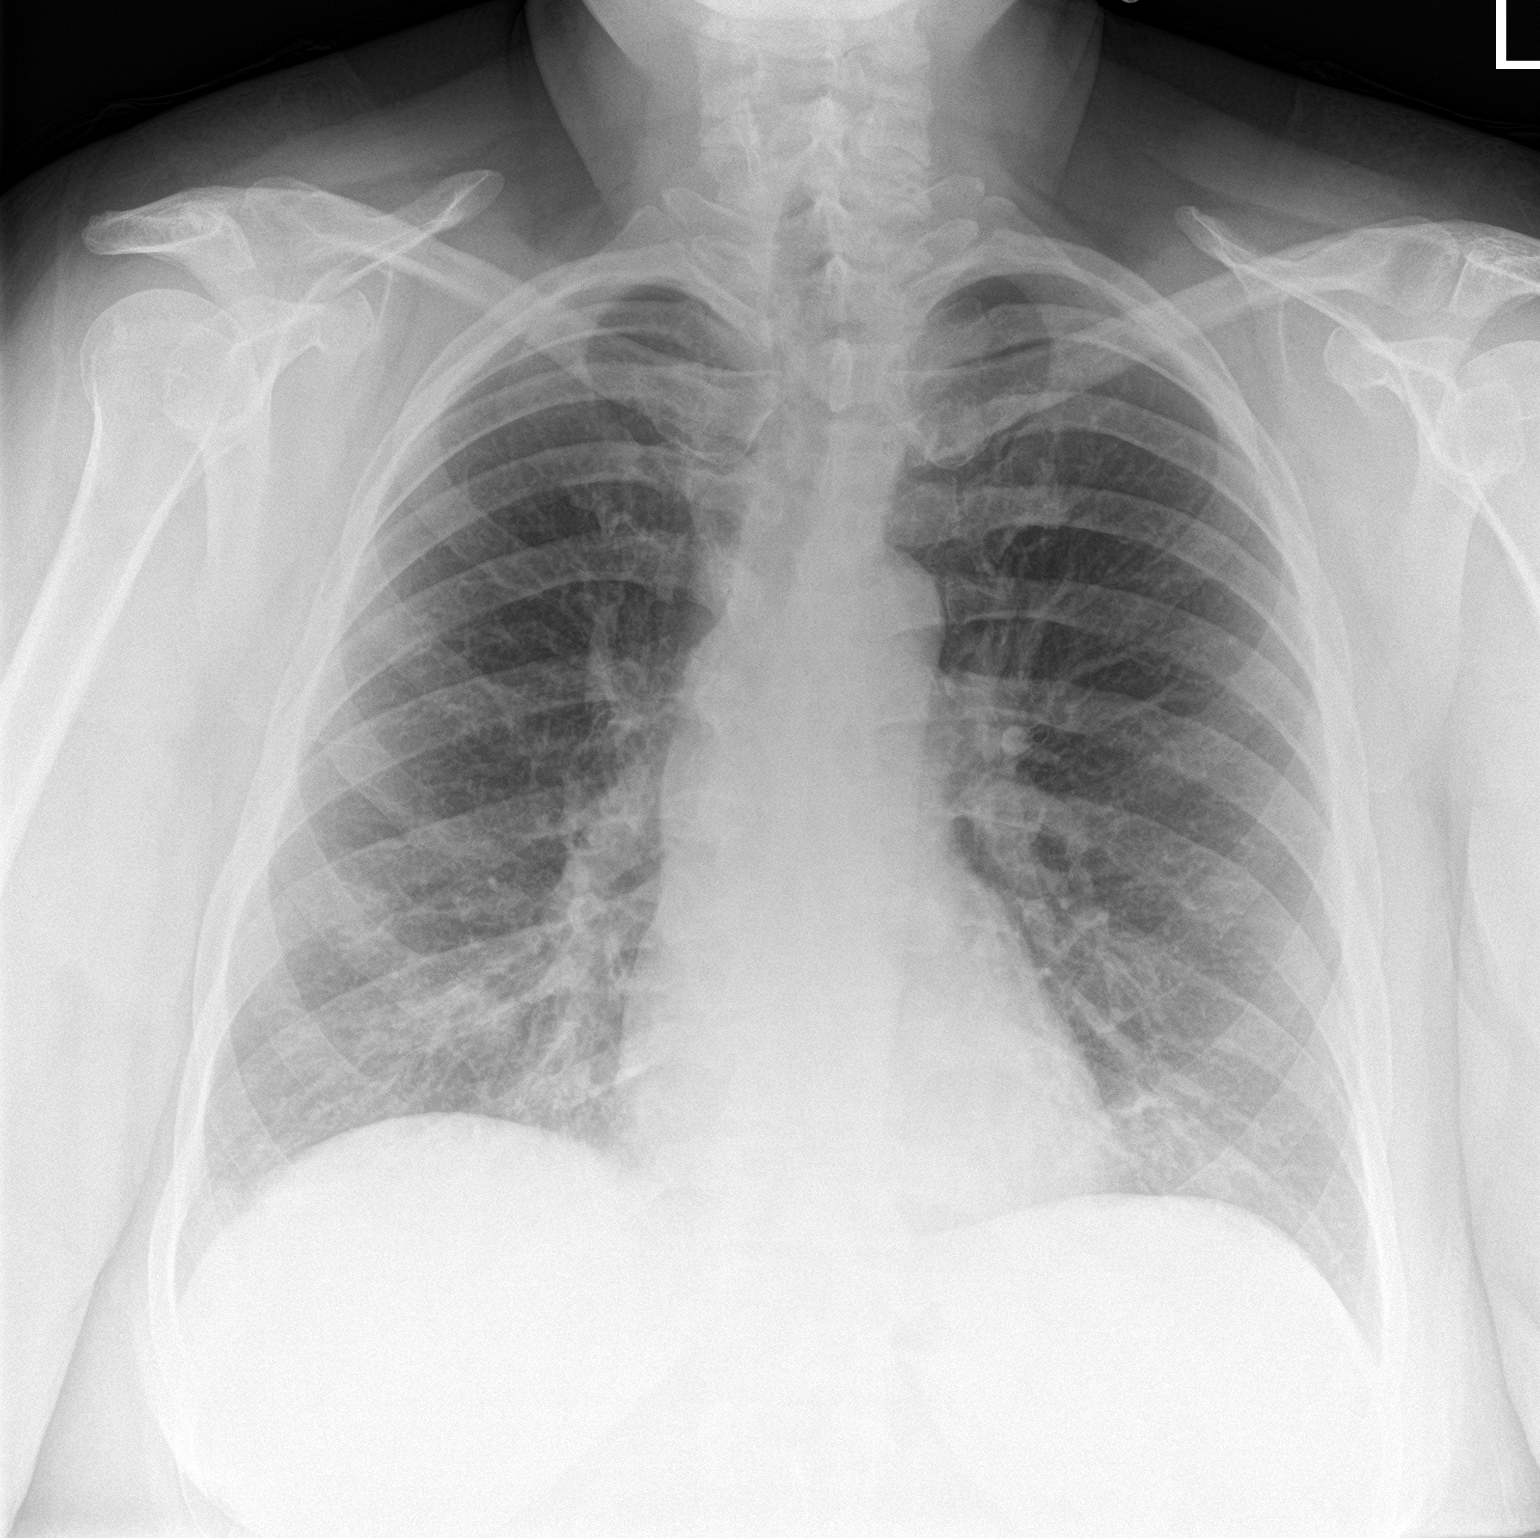

[chest lat]
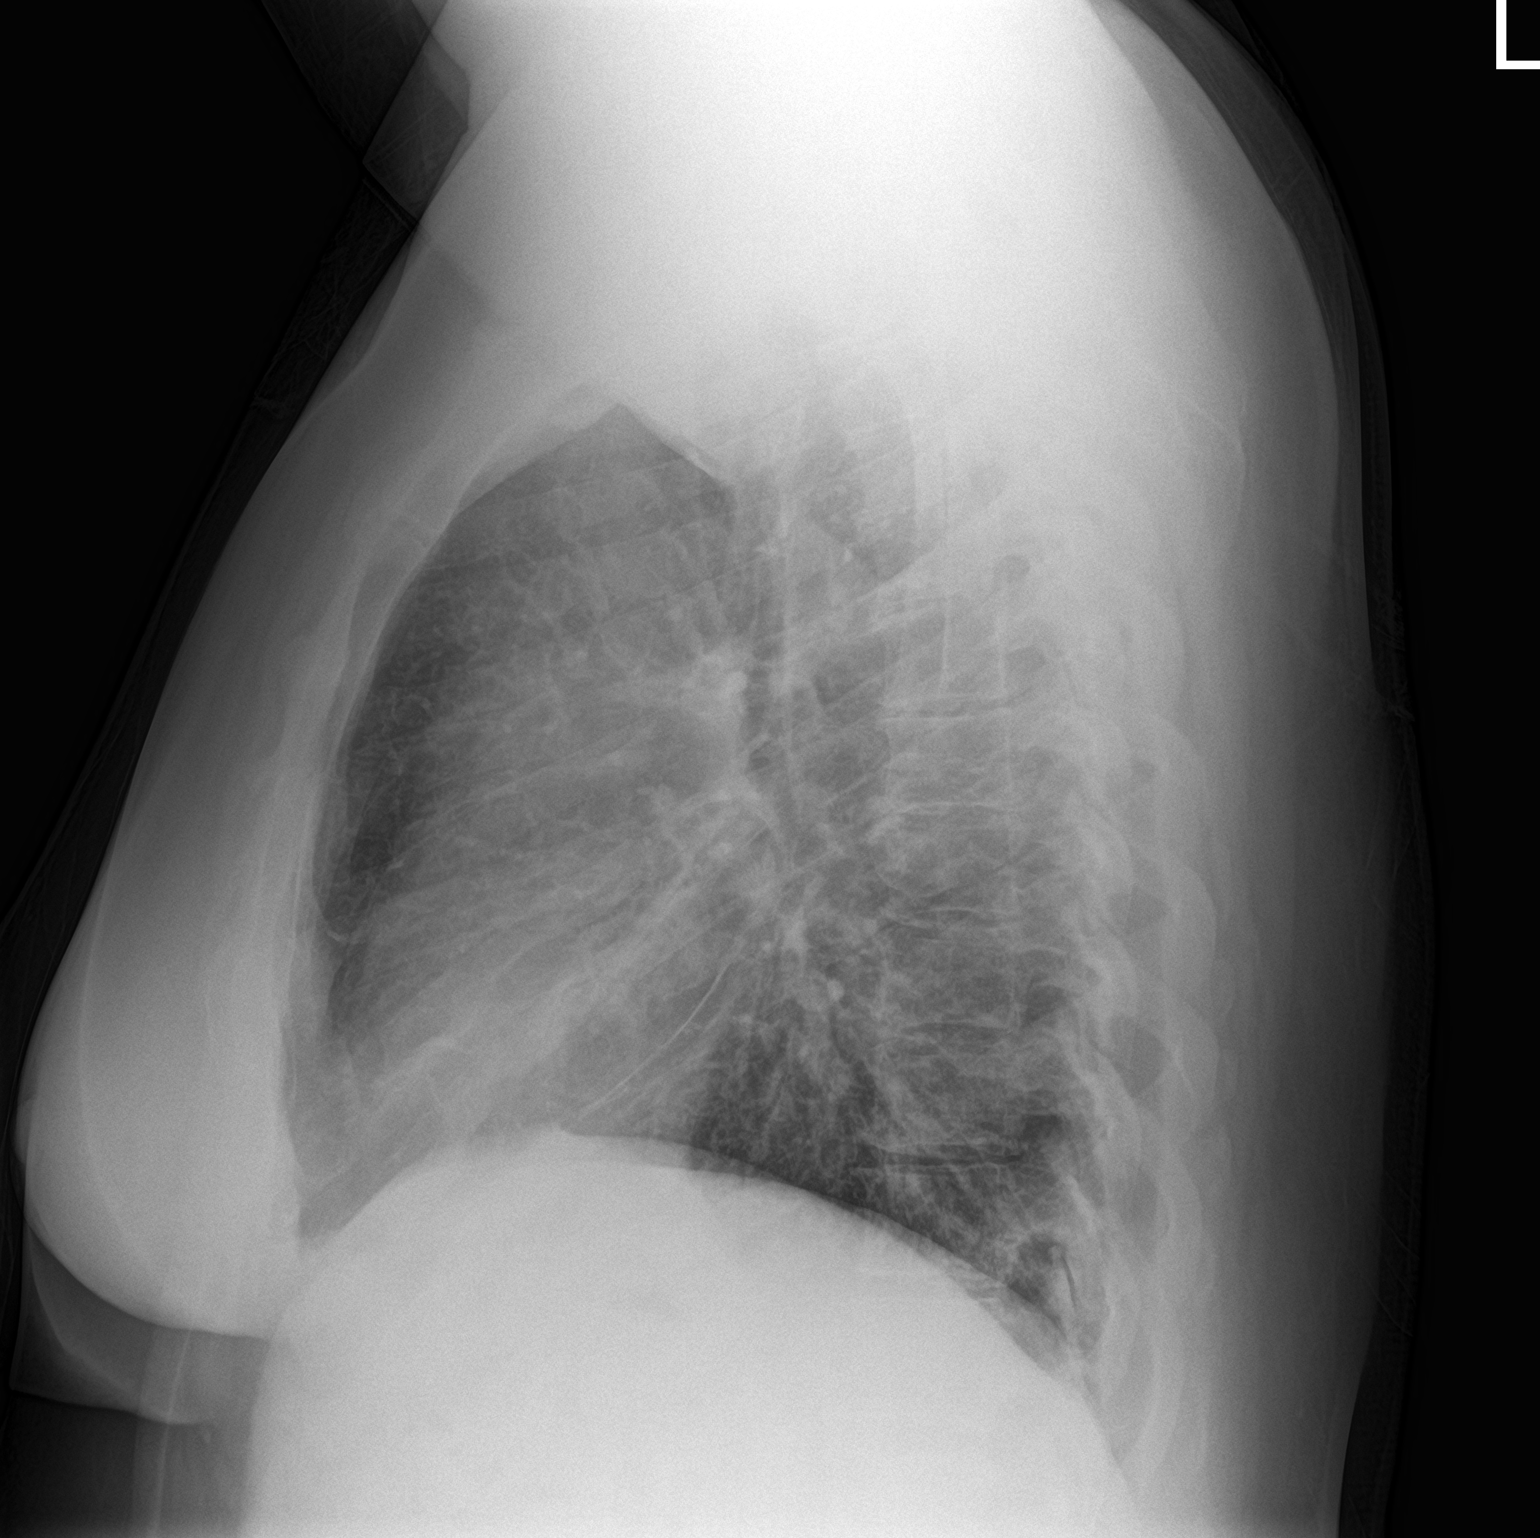

[2 of 2 positions shown; findings below may reference images not displayed]

FINDINGS: Patchy opacities noted in both lower lungs, likely in the right
middle lobe and lingula, right greater than left. This could reflect
early infiltrate/pneumonia. Heart is normal size. No effusions. No
acute bony abnormality.
IMPRESSION: Patchy opacities in both lower lungs, right greater than left
concerning for possible early pneumonia or bronchopneumonia.

## 2019-04-28 IMAGING — US US ABDOMEN LIMITED
1 series · 14 of 25 positions shown · non-contrast
Comparison: None.

EXAM:
ULTRASOUND ABDOMEN LIMITED RIGHT UPPER QUADRANT

[Series 1: us abdomen limited · 0.25mm/px · 14 of 55 slices shown]
[im 1/55]
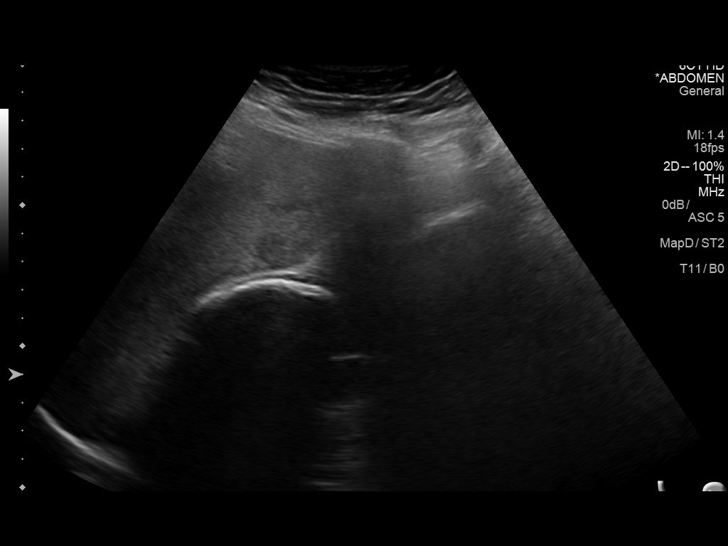
[im 5/55]
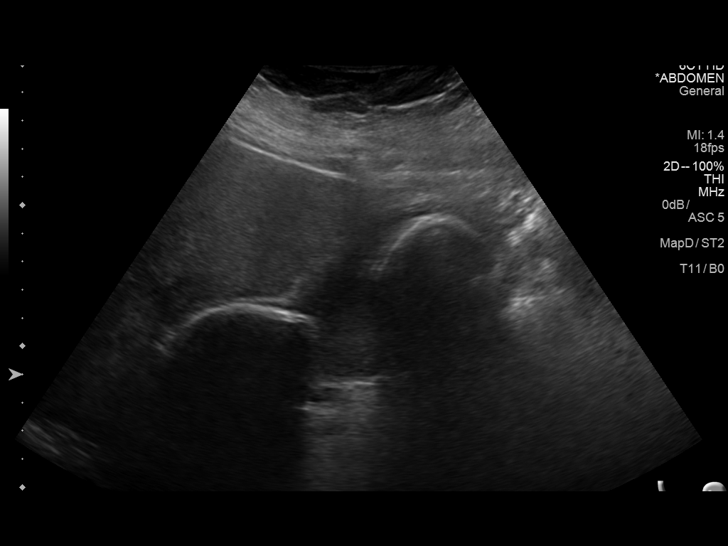
[im 10/55]
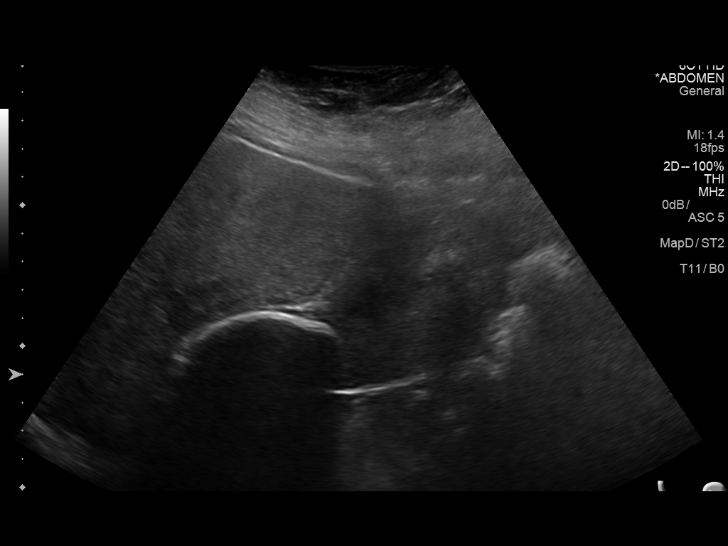
[im 14/55]
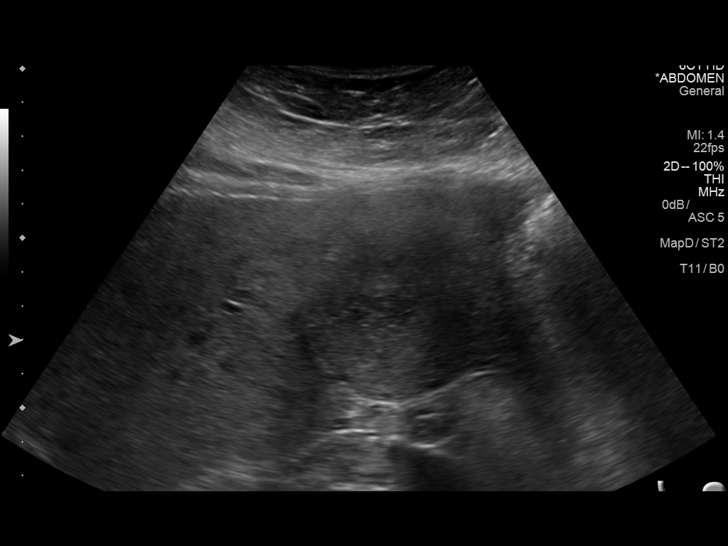
[im 19/55]
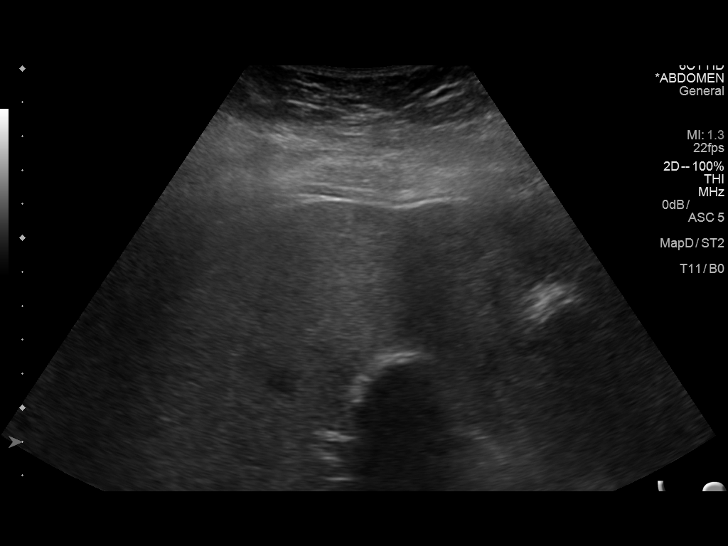
[im 21/55]
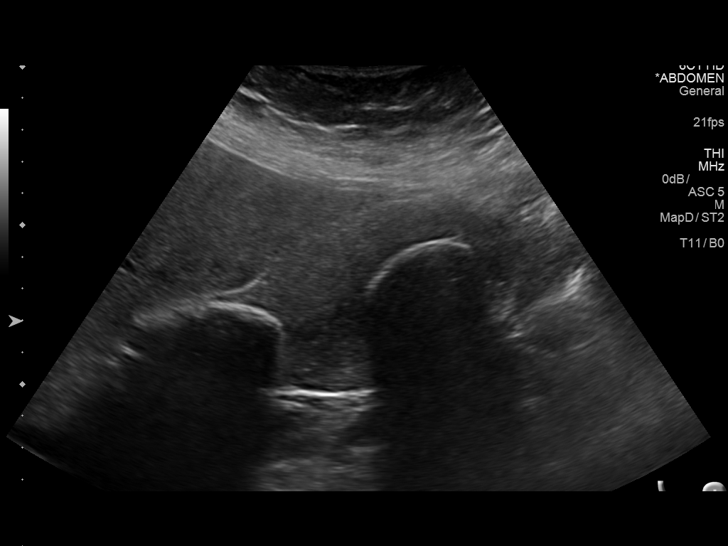
[im 25/55]
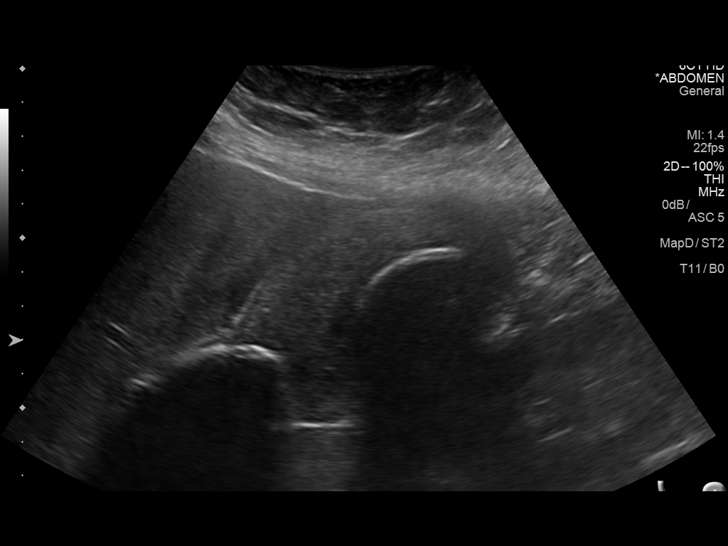
[im 30/55]
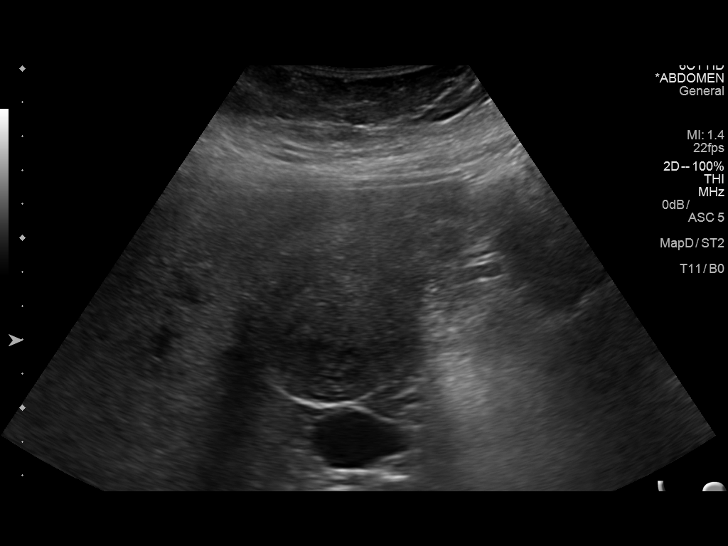
[im 34/55]
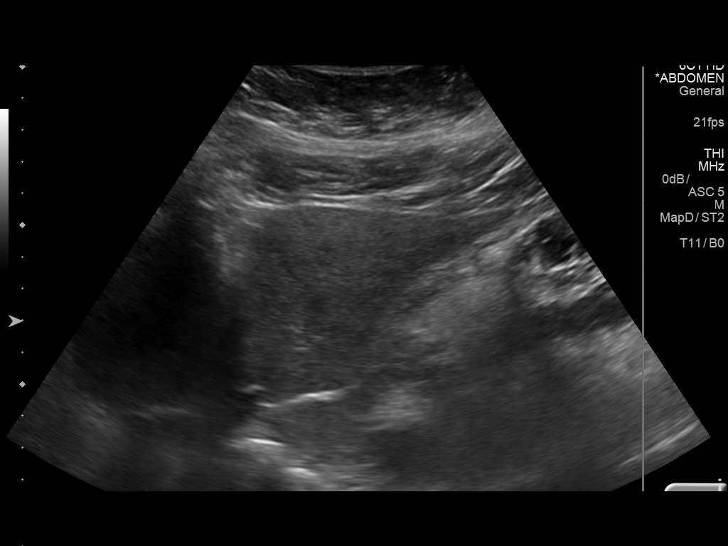
[im 37/55]
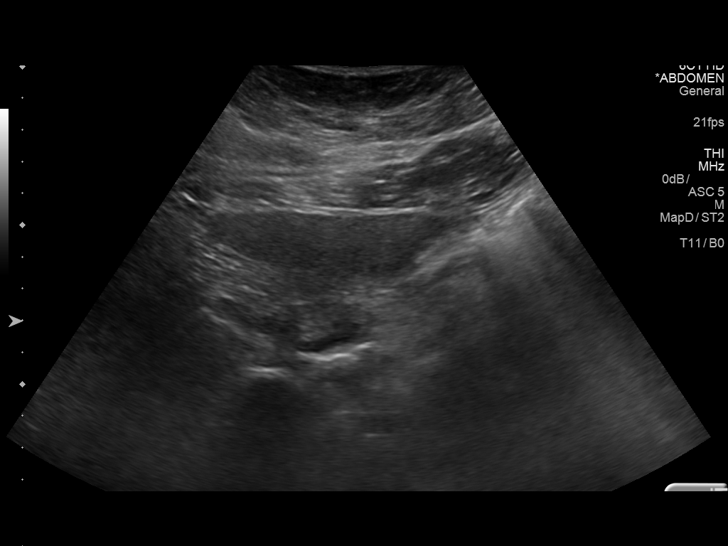
[im 41/55]
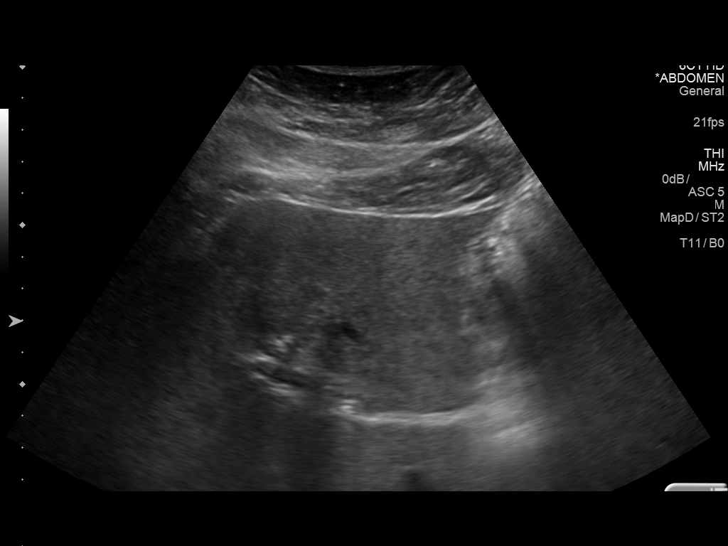
[im 46/55]
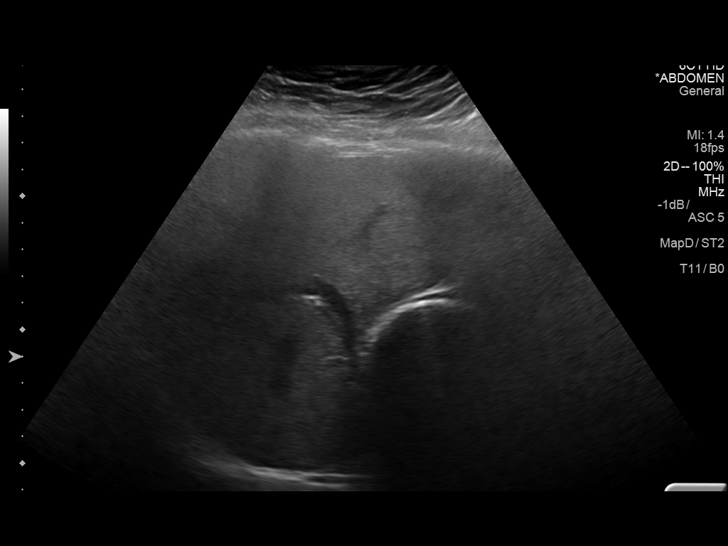
[im 50/55]
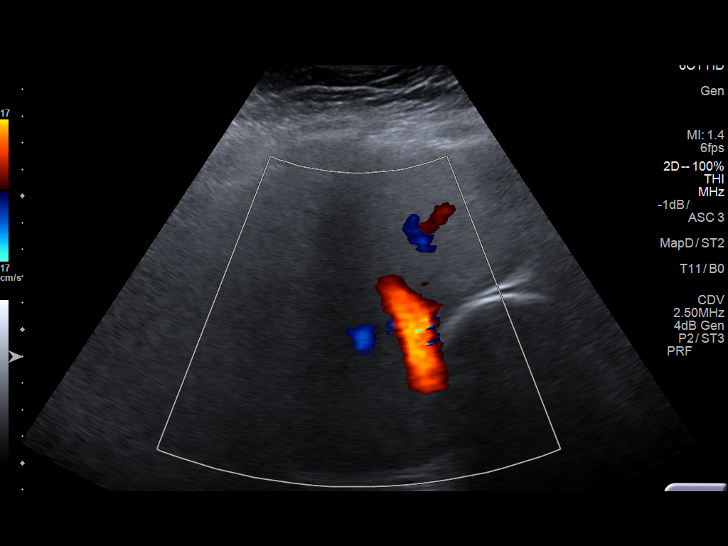
[im 55/55]
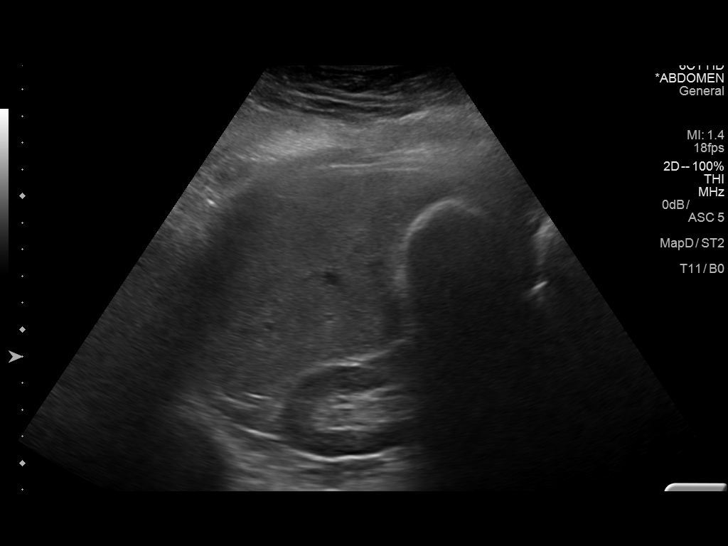

[14 of 25 positions shown; findings below may reference images not displayed]

FINDINGS: Gallbladder:

The gallbladder is distended to over 5 cm in transverse dimension
and filled with echogenic material. Two distinct shadowing calculi
es seen measuring 4.7 and 4.0 cm in diameter. The gallbladder wall
is mildly thickened to 3.6 mm. This sonographic Murphy's sign is
recorded is negative.

Common bile duct:

Not visualized.

Liver:

No focal lesion identified. Diffusely increased parenchymal
echogenicity. Portal vein is patent on color Doppler imaging with
normal direction of blood flow towards the liver.
IMPRESSION: Distended gallbladder containing echogenic material, likely sludge
and two large gallstones. Mild gallbladder wall thickening.

No evidence of pericholecystic fluid. Sonographic Murphy's sign was
reported as negative, arguing against acute cholecystitis.

Diffusely increased echogenicity of the liver usually seen with
hepatic steatosis.
